# Patient Record
Sex: Male | Born: 1985 | Race: White | Hispanic: No | Marital: Single | Smoking: Current every day smoker
Health system: Southern US, Community
[De-identification: ages and names within clinical notes are randomized; demographics above are authoritative.]

## PROBLEM LIST (undated history)

## (undated) DIAGNOSIS — F1110 Opioid abuse, uncomplicated: Secondary | ICD-10-CM

## (undated) HISTORY — PX: OTHER SURGICAL HISTORY: SHX169

---
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
## 2010-08-15 NOTE — Consult Note (Signed)
NAMEKASHEEM, TONER               ACCOUNT NO.:  [id number]   MEDICAL RECORD NO.:  [id number]          PATIENT TYPE:  INP   LOCATION:  3114                         FACILITY:  [facility]   PHYSICIAN:  Mark C. Vernie Ammons, M.D.  DATE OF BIRTH:  [birth year]   DATE OF CONSULTATION:  DATE OF DISCHARGE:                                   CONSULTATION   Audio too short to transcribe (less than 5 seconds)      Mark C. Vernie Ammons, M.D.     MCO/MEDQ  D:  04/17/2005  T:  04/17/2005  Job:  [id number]
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
## 2010-08-15 NOTE — Op Note (Signed)
NAMEDANEIL, BEEM               ACCOUNT NO.:  [id number]   MEDICAL RECORD NO.:  [id number]          PATIENT TYPE:  INP   LOCATION:  3114                         FACILITY:  [facility]   PHYSICIAN:  Cherylynn Ridges, M.D.    DATE OF BIRTH:  [birth year]   DATE OF PROCEDURE:  04/30/2005  DATE OF DISCHARGE:                                 OPERATIVE REPORT   PREPROCEDURE DIAGNOSIS:  Severe adult respiratory distress syndrome with  barotrauma and left pneumothorax.   POSTOPERATIVE DIAGNOSIS:  Severe adult respiratory distress syndrome with  barotrauma and left pneumothorax.   PROCEDURE:  Placement of second 32-French trocar chest tube.   SURGEON:  Dr. Jimmye Norman.   It was done at the bedside and under ICU.   OPERATION:  The patient was prepped with Betadine. He had had a previous a  28-French chest tube put in place which incompletely evacuated a left of  pneumothorax. The second chest tube was put in with trocar after  anesthetizing the skin and Betadine prep. We passed it in the superior  posterior direction using the trocars a guide with minimal bleeding. Chest x-  ray is pending. We secured in place with 2-0 silk suture.      Cherylynn Ridges, M.D.  Electronically Signed     JOW/MEDQ  D:  04/30/2005  T:  04/30/2005  Job:  [id number]
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
## 2014-10-17 ENCOUNTER — Emergency Department (HOSPITAL_BASED_OUTPATIENT_CLINIC_OR_DEPARTMENT_OTHER)
Admission: EM | Admit: 2014-10-17 | Discharge: 2014-10-17 | Disposition: A | Discharge status: Home / Self Care | Payer: 59 | Attending: Emergency Medicine | Admitting: Emergency Medicine

## 2014-10-17 ENCOUNTER — Encounter (HOSPITAL_BASED_OUTPATIENT_CLINIC_OR_DEPARTMENT_OTHER): Payer: Self-pay

## 2014-10-17 DIAGNOSIS — N508 Other specified disorders of male genital organs: Secondary | ICD-10-CM | POA: Diagnosis not present

## 2014-10-17 DIAGNOSIS — Z720 Tobacco use: Secondary | ICD-10-CM | POA: Insufficient documentation

## 2014-10-17 DIAGNOSIS — L299 Pruritus, unspecified: Secondary | ICD-10-CM | POA: Insufficient documentation

## 2014-10-17 DIAGNOSIS — R21 Rash and other nonspecific skin eruption: Secondary | ICD-10-CM

## 2014-10-17 MED ORDER — LIDOCAINE 5 % EX OINT: 1.0000 "application " | TOPICAL_OINTMENT | Freq: Two times a day (BID) | CUTANEOUS | Status: DC | PRN

## 2014-10-17 MED ORDER — CARRINGTON MOISTURE BARRIER EX CREA: TOPICAL_CREAM | CUTANEOUS | Status: DC | PRN

## 2014-10-17 MED ORDER — CEPHALEXIN 500 MG PO CAPS: 500.0000 mg | ORAL_CAPSULE | Freq: Four times a day (QID) | ORAL | Status: AC

## 2014-10-17 NOTE — ED Provider Notes (Signed)
CSN: [id number]     Arrival date & time 10/17/14  1946 History  This chart was scribed for George Memos, MD by Lyndel Safe, ED Scribe. This patient was seen in room MHT13/MHT13 and the patient's care was started 8:04 PM.      Chief Complaint  Patient presents with  . Rash   The history is provided by the patient. No language interpreter was used.   HPI Comments: George Kaiser is a 29 y.o. male, who is a current tobacco smoker, who presents to the Emergency Department complaining of a progressively worsening, constant, painful and mildly pruritic rash to his left sided groin region onset 3 weeks ago. He describes the rash as a white/clear raised rash without drainage. Pt reports the pain to be a burning pain that is exacerbated while walking but resolves fully while he is completely still. The pt has used a benadryl cream which alleviated the pruritic quality but not the pain. Per fiance the pt has a history of a fungal infection. 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 Denies penile discharge or pain, fevers, nausea, vomiting, rashes anywhere else, recent illness, activity change, or EtOH abuse. He and his fiance both additionally denies a history of STDs.   History reviewed. No pertinent past medical history. History reviewed. No pertinent past surgical history. No family history on file. History  Substance Use Topics  . Smoking status: Current Every Day Smoker  . Smokeless tobacco: Not on file  . Alcohol Use: No    Review of Systems  Constitutional: Negative for fever and activity change.  Gastrointestinal: Negative for nausea, vomiting and diarrhea.  Genitourinary: Negative for discharge and penile pain.  Skin: Positive for rash.   Allergies  Review of patient's allergies indicates no known allergies.  Home Medications   Prior to Admission medications   Medication Sig Start Date End Date Taking? Authorizing Provider  cephALEXin (KEFLEX) 500 MG  capsule Take 1 capsule (500 mg total) by mouth 4 (four) times daily. 10/17/14 10/24/14  George Memos, MD  lidocaine (XYLOCAINE) 5 % ointment Apply 1 application topically 2 (two) times daily as needed. Apply to affected area twice daily for pain. 10/17/14   George Memos, MD  Skin Protectants, Misc. (EUCERIN) cream Apply topically as needed for wound care. 10/17/14   George Memos, MD   BP 124/87 mmHg  Pulse 74  Temp(Src) 98.7 F (37.1 C) (Oral)  Resp 18  SpO2 100% Physical Exam  Constitutional: He appears well-developed and well-nourished. No distress.  HENT:  Head: Normocephalic.  Eyes: Conjunctivae are normal. Right eye exhibits no discharge. Left eye exhibits no discharge. No scleral icterus.  Neck: No JVD present.  Pulmonary/Chest: Effort normal. No respiratory distress.  Genitourinary: Penis normal. No penile tenderness.  Neurological: He is alert. Coordination normal.  Skin: Skin is warm. Rash noted. No erythema. No pallor.  5x3cm oval area in left scrotal region with erythema and some ulceration that is tender to touch; no fluctuance; no purulence; minimal induration in the middle. Penile shaft is unaffected; no discharge; no lymphadenopathy in the left penile area.  Psychiatric: He has a normal mood and affect. His behavior is normal.  Nursing note and vitals reviewed.   ED Course  Procedures  DIAGNOSTIC STUDIES: Oxygen Saturation is 100% on RA, normal by my interpretation.    COORDINATION OF CARE: 8:10 PM Discussed treatment plan which includes to prescribe topical medication to alleviate symptoms with pt. Will prescribe Keflex course. Advised  return precautions. Pt acknowledges and agrees to plan.   Labs Review Labs Reviewed - No data to display  Imaging Review No results found.   EKG Interpretation None      MDM   Final diagnoses:  Rash    29 year old male with approximately 1-2 weeks of worsening left scrotal erythema pain and itching. Exam with ulcerations or  erythema some induration. No obvious vesicles or drainage. Rest of his inguinal area unaffected. Possibly cellulitis more likely a irritation from heat sweat dark area. No evidence of fungal involvement this time. I will start antibiotics and some barrier cream. Patient lies if not improving in 3-4 days to return here for further evaluation and management as he does not have a primary doctor.  I feel the patient has had an appropriate workup for their chief complaint at this time and likelihood of emergent condition existing is low. They have been counseled on decision, discharge, follow up and which symptoms necessitate immediate return to the emergency department. They or their family verbally stated understanding and agreement with plan and discharged in stable condition.   I personally performed the services described in this documentation, which was scribed in my presence. The recorded information has been reviewed and is accurate.     George MemosJason Valdis Bevill, MD 10/17/14 2127

## 2014-10-17 NOTE — ED Notes (Signed)
Rash to groin x 3 weeks

## 2016-06-13 ENCOUNTER — Encounter (HOSPITAL_BASED_OUTPATIENT_CLINIC_OR_DEPARTMENT_OTHER): Payer: Self-pay | Admitting: *Deleted

## 2016-06-13 ENCOUNTER — Emergency Department (HOSPITAL_BASED_OUTPATIENT_CLINIC_OR_DEPARTMENT_OTHER): Payer: 59

## 2016-06-13 ENCOUNTER — Emergency Department (HOSPITAL_BASED_OUTPATIENT_CLINIC_OR_DEPARTMENT_OTHER)
Admission: EM | Admit: 2016-06-13 | Discharge: 2016-06-13 | Disposition: A | Discharge status: Home / Self Care | Payer: 59 | Attending: Dermatology | Admitting: Dermatology

## 2016-06-13 DIAGNOSIS — R079 Chest pain, unspecified: Secondary | ICD-10-CM | POA: Insufficient documentation

## 2016-06-13 DIAGNOSIS — F17210 Nicotine dependence, cigarettes, uncomplicated: Secondary | ICD-10-CM | POA: Insufficient documentation

## 2016-06-13 DIAGNOSIS — Z5321 Procedure and treatment not carried out due to patient leaving prior to being seen by health care provider: Secondary | ICD-10-CM | POA: Insufficient documentation

## 2016-06-13 LAB — COMPREHENSIVE METABOLIC PANEL
ALBUMIN: 4.1 g/dL (ref 3.5–5.0)
ALT: 16 U/L — AB (ref 17–63)
AST: 25 U/L (ref 15–41)
Alkaline Phosphatase: 76 U/L (ref 38–126)
Anion gap: 11 (ref 5–15)
BILIRUBIN TOTAL: 0.8 mg/dL (ref 0.3–1.2)
BUN: 10 mg/dL (ref 6–20)
CHLORIDE: 99 mmol/L — AB (ref 101–111)
CO2: 24 mmol/L (ref 22–32)
CREATININE: 0.9 mg/dL (ref 0.61–1.24)
Calcium: 8.3 mg/dL — ABNORMAL LOW (ref 8.9–10.3)
GFR calc Af Amer: >60 mL/min (ref >60)
GLUCOSE: 124 mg/dL — AB (ref 65–99)
POTASSIUM: 3.6 mmol/L (ref 3.5–5.1)
Sodium: 134 mmol/L — ABNORMAL LOW (ref 135–145)
TOTAL PROTEIN: 7.7 g/dL (ref 6.5–8.1)

## 2016-06-13 LAB — CBC WITH DIFFERENTIAL/PLATELET
Basophils Absolute: 0 10*3/uL (ref 0.0–0.1)
Basophils Relative: 0 %
EOS ABS: 0.1 10*3/uL (ref 0.0–0.7)
EOS PCT: 1 %
HCT: 42.3 % (ref 39.0–52.0)
Hemoglobin: 14.6 g/dL (ref 13.0–17.0)
LYMPHS ABS: 1.5 10*3/uL (ref 0.7–4.0)
LYMPHS PCT: 14 %
MCH: 30.7 pg (ref 26.0–34.0)
MCHC: 34.5 g/dL (ref 30.0–36.0)
MCV: 88.9 fL (ref 78.0–100.0)
MONO ABS: 1 10*3/uL (ref 0.1–1.0)
Monocytes Relative: 10 %
Neutro Abs: 7.7 10*3/uL (ref 1.7–7.7)
Neutrophils Relative %: 75 %
PLATELETS: 184 10*3/uL (ref 150–400)
RBC: 4.76 MIL/uL (ref 4.22–5.81)
RDW: 12.5 % (ref 11.5–15.5)
WBC: 10.3 10*3/uL (ref 4.0–10.5)

## 2016-06-13 LAB — TROPONIN I

## 2016-06-13 NOTE — ED Notes (Signed)
Pt called outside and in both lobbies. No answer.

## 2016-06-13 NOTE — ED Triage Notes (Signed)
Pt reports left sided CP x 6 days.  Denies known injury.  Increased pain with coughing.  denies radiation, SOB, N/V.

## 2016-06-17 ENCOUNTER — Emergency Department (HOSPITAL_BASED_OUTPATIENT_CLINIC_OR_DEPARTMENT_OTHER)
Admission: EM | Admit: 2016-06-17 | Discharge: 2016-06-17 | Disposition: A | Discharge status: Home / Self Care | Payer: 59 | Attending: Emergency Medicine | Admitting: Emergency Medicine

## 2016-06-17 ENCOUNTER — Encounter (HOSPITAL_BASED_OUTPATIENT_CLINIC_OR_DEPARTMENT_OTHER): Payer: Self-pay

## 2016-06-17 DIAGNOSIS — F1123 Opioid dependence with withdrawal: Secondary | ICD-10-CM | POA: Insufficient documentation

## 2016-06-17 DIAGNOSIS — F1193 Opioid use, unspecified with withdrawal: Secondary | ICD-10-CM

## 2016-06-17 DIAGNOSIS — F17210 Nicotine dependence, cigarettes, uncomplicated: Secondary | ICD-10-CM | POA: Insufficient documentation

## 2016-06-17 HISTORY — DX: Opioid abuse, uncomplicated: F11.10

## 2016-06-17 MED ORDER — CLONIDINE HCL 0.1 MG PO TABS: ORAL_TABLET | ORAL | 0 refills | Status: AC

## 2016-06-17 MED ORDER — ONDANSETRON 4 MG PO TBDP: 4.0000 mg | ORAL_TABLET | ORAL | 0 refills | Status: AC | PRN

## 2016-06-17 NOTE — ED Triage Notes (Addendum)
Pt states "I need detox"-last used heroin 5pm today-brought in by father-NAD-steady gait-answered all ?s w/o difficulty-eyes would close while seated in triage

## 2016-06-17 NOTE — ED Provider Notes (Signed)
[facility] Provider Note   CSN: [id number] Arrival date & time: 06/17/16  1947  By signing my name below, I, George Kaiser, attest that this documentation has been prepared under the direction and in the presence of Arby BarretteMarcy Yoni Lobos, MD. Electronically Signed: Modena JanskyAlbert Kaiser, Scribe. 06/17/2016. 10:17 PM.  History   Chief Complaint Chief Complaint  Patient presents with  . Drug Problem   The history is provided by the patient. No language interpreter was used.   HPI Comments: George Kaiser is a 31 y.o. male with a PMHx of heroin/opiate abuse who presents to the Emergency Department for drug detox. He was told by Delight StareArca to come to the ED for clearance before [facility]. He last used heroin via snorting about 5 hours ago. He denies any other complaints.   Past Medical History:  Diagnosis Date  . Heroin abuse   . Opiate abuse, continuous     There are no active problems to display for this patient.   Past Surgical History:  Procedure Laterality Date  . head surgery     metal plate r/t injury       Home Medications    Prior to Admission medications   Medication Sig Start Date End Date Taking? Authorizing Provider  cloNIDine (CATAPRES) 0.1 MG tablet 1 tab po tid x 2 days, then bid x 2 days, then once daily x 2 days 06/17/16   Arby BarretteMarcy Jarin Cornfield, MD  ondansetron (ZOFRAN ODT) 4 MG disintegrating tablet Take 1 tablet (4 mg total) by mouth every 4 (four) hours as needed for nausea or vomiting. 06/17/16   Arby BarretteMarcy Brieanne Mignone, MD    Family History No family history on file.  Social History Social History  Substance Use Topics  . Smoking status: Current Every Day Smoker    Packs/day: 2.00    Types: Cigarettes  . Smokeless tobacco: Never Used  . Alcohol use No     Allergies   Patient has no known allergies.   Review of Systems Review of Systems A complete 10 system review of systems was obtained and all systems are negative except as noted  in the HPI and PMH.   Physical Exam Updated Vital Signs Pulse (!) 103   Temp 97.7 F (36.5 C) (Oral)   Resp 18   Ht 5\' 5"  (1.651 m)   Wt 140 lb (63.5 kg)   SpO2 96%   BMI 23.30 kg/m   Physical Exam  Constitutional: He appears well-developed and well-nourished. No distress.  HENT:  Head: Normocephalic and atraumatic.  Mouth/Throat: Oropharynx is clear and moist. No oropharyngeal exudate.  Eyes: Conjunctivae are normal. Pupils are equal, round, and reactive to light.  Neck: Neck supple.  Cardiovascular: Normal rate and regular rhythm.   Pulmonary/Chest: Effort normal. No respiratory distress. He has no wheezes. He has no rales.  Abdominal: Soft.  Musculoskeletal: Normal range of motion.  Neurological: He is alert.  Skin: Skin is warm and dry.  Psychiatric: He has a normal mood and affect.  Nursing note and vitals reviewed.    ED Treatments / Results  DIAGNOSTIC STUDIES: Oxygen Saturation is 96% on RA, normal by my interpretation.    COORDINATION OF CARE: 10:21 PM- Pt advised of plan for treatment and pt agrees.  Labs (all labs ordered are listed, but only abnormal results are displayed) Labs Reviewed - No data to display  EKG  EKG Interpretation None       Radiology No results found.  Procedures Procedures (including  critical care time)  Medications Ordered in ED Medications - No data to display   Initial Impression / Assessment and Plan / ED Course  I have reviewed the triage vital signs and the nursing notes.  Pertinent labs & imaging results that were available during my care of the patient were reviewed by me and considered in my medical decision making (see chart for details).      Final Clinical Impressions(s) / ED Diagnoses   Final diagnoses:  Heroin withdrawal (HCC)   Mental status clear. No signs of dehydration. Patient does not have any signs of immediate clinical illness. He is counseled that inpatient opiate withdrawal is not done  through the ED.  Patient given clonidine and Zofran for opiate withdrawal symptoms and counseled on follow-up plan. New Prescriptions Discharge Medication List as of 06/17/2016 10:29 PM    START taking these medications   Details  cloNIDine (CATAPRES) 0.1 MG tablet 1 tab po tid x 2 days, then bid x 2 days, then once daily x 2 days, Print    ondansetron (ZOFRAN ODT) 4 MG disintegrating tablet Take 1 tablet (4 mg total) by mouth every 4 (four) hours as needed for nausea or vomiting., Starting Wed 06/17/2016, Print           Arby Barrette, MD 06/27/16 1255

## 2022-02-27 DIAGNOSIS — Z419 Encounter for procedure for purposes other than remedying health state, unspecified: Secondary | ICD-10-CM | POA: Diagnosis not present

## 2022-03-30 DIAGNOSIS — Z419 Encounter for procedure for purposes other than remedying health state, unspecified: Secondary | ICD-10-CM | POA: Diagnosis not present

## 2022-04-30 DIAGNOSIS — Z419 Encounter for procedure for purposes other than remedying health state, unspecified: Secondary | ICD-10-CM | POA: Diagnosis not present

## 2022-05-29 DIAGNOSIS — Z419 Encounter for procedure for purposes other than remedying health state, unspecified: Secondary | ICD-10-CM | POA: Diagnosis not present

## 2022-06-29 DIAGNOSIS — Z419 Encounter for procedure for purposes other than remedying health state, unspecified: Secondary | ICD-10-CM | POA: Diagnosis not present

## 2022-07-29 DIAGNOSIS — Z419 Encounter for procedure for purposes other than remedying health state, unspecified: Secondary | ICD-10-CM | POA: Diagnosis not present

## 2022-08-29 DIAGNOSIS — Z419 Encounter for procedure for purposes other than remedying health state, unspecified: Secondary | ICD-10-CM | POA: Diagnosis not present

## 2022-09-28 DIAGNOSIS — Z419 Encounter for procedure for purposes other than remedying health state, unspecified: Secondary | ICD-10-CM | POA: Diagnosis not present

## 2022-10-29 DIAGNOSIS — Z419 Encounter for procedure for purposes other than remedying health state, unspecified: Secondary | ICD-10-CM | POA: Diagnosis not present

## 2022-11-29 DIAGNOSIS — Z419 Encounter for procedure for purposes other than remedying health state, unspecified: Secondary | ICD-10-CM | POA: Diagnosis not present

## 2022-12-29 DIAGNOSIS — Z419 Encounter for procedure for purposes other than remedying health state, unspecified: Secondary | ICD-10-CM | POA: Diagnosis not present
